# Patient Record
Sex: Male | Born: 1955 | Race: White | Hispanic: No | Smoking: Never smoker
Health system: Southern US, Community
[De-identification: ages and names within clinical notes are randomized; demographics above are authoritative.]

## PROBLEM LIST (undated history)

## (undated) DIAGNOSIS — I498 Other specified cardiac arrhythmias: Secondary | ICD-10-CM

## (undated) DIAGNOSIS — G4733 Obstructive sleep apnea (adult) (pediatric): Secondary | ICD-10-CM

## (undated) DIAGNOSIS — I10 Essential (primary) hypertension: Secondary | ICD-10-CM

## (undated) HISTORY — PX: SHOULDER SURGERY: SHX246

## (undated) HISTORY — PX: NASAL SEPTUM SURGERY: SHX37

---

## 2004-04-16 ENCOUNTER — Observation Stay: Payer: Self-pay | Admitting: Internal Medicine

## 2004-04-16 ENCOUNTER — Other Ambulatory Visit: Payer: Self-pay

## 2004-05-07 ENCOUNTER — Ambulatory Visit: Payer: Self-pay | Admitting: Specialist

## 2007-02-18 ENCOUNTER — Ambulatory Visit: Payer: Self-pay | Admitting: Otolaryngology

## 2007-06-06 ENCOUNTER — Emergency Department: Payer: Self-pay | Admitting: Unknown Physician Specialty

## 2007-08-23 ENCOUNTER — Ambulatory Visit: Payer: Self-pay | Admitting: Otolaryngology

## 2007-10-05 ENCOUNTER — Ambulatory Visit: Payer: Self-pay | Admitting: Otolaryngology

## 2007-10-15 ENCOUNTER — Ambulatory Visit: Payer: Self-pay | Admitting: Otolaryngology

## 2008-05-19 ENCOUNTER — Ambulatory Visit: Payer: Self-pay | Admitting: Unknown Physician Specialty

## 2009-01-09 ENCOUNTER — Ambulatory Visit: Payer: Self-pay | Admitting: Specialist

## 2009-03-06 ENCOUNTER — Ambulatory Visit: Payer: Self-pay | Admitting: Specialist

## 2009-03-14 ENCOUNTER — Ambulatory Visit: Payer: Self-pay | Admitting: Specialist

## 2009-04-06 ENCOUNTER — Encounter: Payer: Self-pay | Admitting: Specialist

## 2009-05-01 ENCOUNTER — Encounter: Payer: Self-pay | Admitting: Specialist

## 2010-12-03 ENCOUNTER — Emergency Department: Payer: Self-pay | Admitting: *Deleted

## 2013-05-27 ENCOUNTER — Ambulatory Visit: Payer: Self-pay | Admitting: Unknown Physician Specialty

## 2013-05-30 LAB — PATHOLOGY REPORT

## 2015-03-30 DIAGNOSIS — H52203 Unspecified astigmatism, bilateral: Secondary | ICD-10-CM | POA: Diagnosis not present

## 2015-05-14 DIAGNOSIS — E538 Deficiency of other specified B group vitamins: Secondary | ICD-10-CM | POA: Diagnosis not present

## 2015-05-14 DIAGNOSIS — Z0000 Encounter for general adult medical examination without abnormal findings: Secondary | ICD-10-CM | POA: Diagnosis not present

## 2015-05-14 DIAGNOSIS — Z125 Encounter for screening for malignant neoplasm of prostate: Secondary | ICD-10-CM | POA: Diagnosis not present

## 2015-05-14 DIAGNOSIS — I10 Essential (primary) hypertension: Secondary | ICD-10-CM | POA: Diagnosis not present

## 2015-05-21 DIAGNOSIS — E538 Deficiency of other specified B group vitamins: Secondary | ICD-10-CM | POA: Diagnosis not present

## 2015-05-21 DIAGNOSIS — Z0000 Encounter for general adult medical examination without abnormal findings: Secondary | ICD-10-CM | POA: Diagnosis not present

## 2015-07-10 DIAGNOSIS — M79644 Pain in right finger(s): Secondary | ICD-10-CM | POA: Diagnosis not present

## 2015-07-10 DIAGNOSIS — S63659A Sprain of metacarpophalangeal joint of unspecified finger, initial encounter: Secondary | ICD-10-CM | POA: Diagnosis not present

## 2015-07-10 DIAGNOSIS — M65351 Trigger finger, right little finger: Secondary | ICD-10-CM | POA: Diagnosis not present

## 2015-07-31 ENCOUNTER — Emergency Department
Admission: EM | Admit: 2015-07-31 | Discharge: 2015-07-31 | Disposition: A | Discharge status: Home / Self Care | Payer: 59 | Attending: Emergency Medicine | Admitting: Emergency Medicine

## 2015-07-31 ENCOUNTER — Encounter: Payer: Self-pay | Admitting: *Deleted

## 2015-07-31 ENCOUNTER — Emergency Department: Payer: 59

## 2015-07-31 DIAGNOSIS — S0993XA Unspecified injury of face, initial encounter: Secondary | ICD-10-CM | POA: Diagnosis not present

## 2015-07-31 DIAGNOSIS — S060X1A Concussion with loss of consciousness of 30 minutes or less, initial encounter: Secondary | ICD-10-CM | POA: Insufficient documentation

## 2015-07-31 DIAGNOSIS — Y998 Other external cause status: Secondary | ICD-10-CM | POA: Diagnosis not present

## 2015-07-31 DIAGNOSIS — Z79899 Other long term (current) drug therapy: Secondary | ICD-10-CM | POA: Diagnosis not present

## 2015-07-31 DIAGNOSIS — Y9289 Other specified places as the place of occurrence of the external cause: Secondary | ICD-10-CM | POA: Diagnosis not present

## 2015-07-31 DIAGNOSIS — R6884 Jaw pain: Secondary | ICD-10-CM | POA: Diagnosis not present

## 2015-07-31 DIAGNOSIS — I10 Essential (primary) hypertension: Secondary | ICD-10-CM | POA: Diagnosis not present

## 2015-07-31 DIAGNOSIS — S069X9A Unspecified intracranial injury with loss of consciousness of unspecified duration, initial encounter: Secondary | ICD-10-CM | POA: Diagnosis not present

## 2015-07-31 DIAGNOSIS — S0083XA Contusion of other part of head, initial encounter: Secondary | ICD-10-CM | POA: Insufficient documentation

## 2015-07-31 DIAGNOSIS — W51XXXA Accidental striking against or bumped into by another person, initial encounter: Secondary | ICD-10-CM | POA: Insufficient documentation

## 2015-07-31 DIAGNOSIS — S199XXA Unspecified injury of neck, initial encounter: Secondary | ICD-10-CM | POA: Diagnosis not present

## 2015-07-31 DIAGNOSIS — Y9364 Activity, baseball: Secondary | ICD-10-CM | POA: Insufficient documentation

## 2015-07-31 DIAGNOSIS — R220 Localized swelling, mass and lump, head: Secondary | ICD-10-CM | POA: Diagnosis not present

## 2015-07-31 HISTORY — DX: Essential (primary) hypertension: I10

## 2015-07-31 HISTORY — DX: Other specified cardiac arrhythmias: I49.8

## 2015-07-31 MED ORDER — IBUPROFEN 600 MG PO TABS: 600.0000 mg | ORAL_TABLET | Freq: Three times a day (TID) | ORAL | Status: DC | PRN

## 2015-07-31 MED ORDER — HYDROCODONE-ACETAMINOPHEN 5-325 MG PO TABS: 1.0000 | ORAL_TABLET | Freq: Four times a day (QID) | ORAL | Status: DC | PRN

## 2015-07-31 MED ORDER — IBUPROFEN 600 MG PO TABS
600.0000 mg | ORAL_TABLET | Freq: Once | ORAL | Status: AC
  Administered 2015-07-31: 600 mg via ORAL

## 2015-07-31 MED ORDER — ONDANSETRON HCL 4 MG/2ML IJ SOLN
4.0000 mg | Freq: Once | INTRAMUSCULAR | Status: AC
  Administered 2015-07-31: 4 mg via INTRAVENOUS

## 2015-07-31 MED ORDER — MORPHINE SULFATE (PF) 2 MG/ML IV SOLN
2.0000 mg | Freq: Once | INTRAVENOUS | Status: AC
Start: 2015-07-31 — End: 2015-07-31
  Administered 2015-07-31: 2 mg via INTRAVENOUS

## 2015-07-31 NOTE — Discharge Instructions (Signed)
You were evaluated after head and facial injury and no serious injuries were found. By history it sounds like you will did suffer with a concussion, need to follow up with your primary care physician to ensure complete resolution of the concussion.   Concussion, Adult A concussion is a brain injury. It is caused by:  A hit to the head.  A quick and sudden movement (jolt) of the head or neck. A concussion is usually not life threatening. Even so, it can cause serious problems. If you had a concussion before, you may have concussion-like problems after a hit to your head. HOME CARE General Instructions  Follow your doctor's directions carefully.  Take medicines only as told by your doctor.  Only take medicines your doctor says are safe.  Do not drink alcohol until your doctor says it is okay. Alcohol and some drugs can slow down healing. They can also put you at risk for further injury.  If you are having trouble remembering things, write them down.  Try to do one thing at a time if you get distracted easily. For example, do not watch TV while making dinner.  Talk to your family members or close friends when making important decisions.  Follow up with your doctor as told.  Watch your symptoms. Tell others to do the same. Serious problems can sometimes happen after a concussion. Older adults are more likely to have these problems.  Tell your teachers, school nurse, school counselor, coach, Product/process development scientist, or work Freight forwarder about your concussion. Tell them about what you can or cannot do. They should watch to see if:  It gets even harder for you to pay attention or concentrate.  It gets even harder for you to remember things or learn new things.  You need more time than normal to finish things.  You become annoyed (irritable) more than before.  You are not able to deal with stress as well.  You have more problems than before.  Rest. Make sure you:  Get plenty of sleep at  night.  Go to sleep early.  Go to bed at the same time every day. Try to wake up at the same time.  Rest during the day.  Take naps when you feel tired.  Limit activities where you have to think a lot or concentrate. These include:  Doing homework.  Doing work related to a job.  Watching TV.  Using the computer. Returning To Your Regular Activities Return to your normal activities slowly, not all at once. You must give your body and brain enough time to heal.   Do not play sports or do other athletic activities until your doctor says it is okay.  Ask your doctor when you can drive, ride a bicycle, or work other vehicles or machines. Never do these things if you feel dizzy.  Ask your doctor about when you can return to work or school. Preventing Another Concussion It is very important to avoid another brain injury, especially before you have healed. In rare cases, another injury can lead to permanent brain damage, brain swelling, or death. The risk of this is greatest during the first 7-10 days after your injury. Avoid injuries by:   Wearing a seat belt when riding in a car.  Not drinking too much alcohol.  Avoiding activities that could lead to a second concussion (such as contact sports).  Wearing a helmet when doing activities like:  Biking.  Skiing.  Skateboarding.  Skating.  Making your home  safer by:  Removing things from the floor or stairways that could make you trip.  Using grab bars in bathrooms and handrails by stairs.  Placing non-slip mats on floors and in bathtubs.  Improve lighting in dark areas. GET HELP IF:  It gets even harder for you to pay attention or concentrate.  It gets even harder for you to remember things or learn new things.  You need more time than normal to finish things.  You become annoyed (irritable) more than before.  You are not able to deal with stress as well.  You have more problems than before.  You have  problems keeping your balance.  You are not able to react quickly when you should. Get help if you have any of these problems for more than 2 weeks:   Lasting (chronic) headaches.  Dizziness or trouble balancing.  Feeling sick to your stomach (nausea).  Seeing (vision) problems.  Being affected by noises or light more than normal.  Feeling sad, low, down in the dumps, blue, gloomy, or empty (depressed).  Mood changes (mood swings).  Feeling of fear or nervousness about what may happen (anxiety).  Feeling annoyed.  Memory problems.  Problems concentrating or paying attention.  Sleep problems.  Feeling tired all the time. GET HELP RIGHT AWAY IF:   You have bad headaches or your headaches get worse.  You have weakness (even if it is in one hand, leg, or part of the face).  You have loss of feeling (numbness).  You feel off balance.  You keep throwing up (vomiting).  You feel tired.  One black center of your eye (pupil) is larger than the other.  You twitch or shake violently (convulse).  Your speech is not clear (slurred).  You are more confused, easily angered (agitated), or annoyed than before.  You have more trouble resting than before.  You are unable to recognize people or places.  You have neck pain.  It is difficult to wake you up.  You have unusual behavior changes.  You pass out (lose consciousness). MAKE SURE YOU:   Understand these instructions.  Will watch your condition.  Will get help right away if you are not doing well or get worse.   This information is not intended to replace advice given to you by your health care provider. Make sure you discuss any questions you have with your health care provider.   Document Released: 02/05/2009 Document Revised: 03/10/2014 Document Reviewed: 09/09/2012 Elsevier Interactive Patient Education 2016 [place].  Facial or Scalp Contusion  A facial or scalp contusion is a deep bruise on the  face or head. Contusions happen when an injury causes bleeding under the skin. Signs of bruising include pain, puffiness (swelling), and discolored skin. The contusion may turn blue, purple, or yellow. HOME CARE  Only take medicines as told by your doctor.  Put ice on the injured area.  Put ice in a plastic bag.  Place a towel between your skin and the bag.  Leave the ice on for 20 minutes, 2-3 times a day. GET HELP IF:  You have bite problems.  You have pain when chewing.  You are worried about your face not healing normally. GET HELP RIGHT AWAY IF:   You have severe pain or a headache and medicine does not help.  You are very tired or confused, or your personality changes.  You throw up (vomit).  You have a nosebleed that will not stop.  You see two  of everything (double vision) or have blurry vision.  You have fluid coming from your nose or ear.  You have problems walking or using your arms or legs. MAKE SURE YOU:   Understand these instructions.  Will watch your condition.  Will get help right away if you are not doing well or get worse.   This information is not intended to replace advice given to you by your health care provider. Make sure you discuss any questions you have with your health care provider.   Document Released: 02/06/2011 Document Revised: 03/10/2014 Document Reviewed: 09/30/2012 Elsevier Interactive Patient Education [employer].

## 2015-07-31 NOTE — ED Provider Notes (Addendum)
[facility] Emergency Department Provider Note   ____________________________________________  Time seen: Approximately 9:25 PM I have reviewed the triage vital signs and the triage nursing note.  HISTORY  Chief Complaint Facial Injury   [place] Patient, wife, and patient's son who was at the game  HPI Greg Barnes is a 60 y.o. male not on any blood thinners, who was playing in a softball game when he collided with another player going for a fly ball, and was struck in the left side of the face with the other player's shoulder. He had immediate loss of consciousness and fell to the ground and protected. He had loss of consciousness for approximately 45 seconds and then in and out of consciousness for about 3-4 more minutes before fully regain consciousness. He still has some amnesia to the event. He is complaining of left sided headache, left facial pain, and some blood in the mouth. He is also complaining of some neck pain, was apparently moderate at the time of injury, and now somewhat better.  His left facial pain is the worst over the left maxilla, and he is rating it about a 7 out of 10.  No back pain, chest pain, trouble breathing, abdominal pain, or extremity pain.    Past Medical History  Diagnosis Date  . Hypertension   . Sinus arrhythmia     intermittent    There are no active problems to display for this patient.   Past Surgical History  Procedure Laterality Date  . Nasal septum surgery    . Shoulder surgery Right     Current Outpatient Rx  Name  Route  Sig  Dispense  Refill  . cyanocobalamin 500 MCG tablet   Oral   Take 500 mcg by mouth daily.         Marland Kitchen telmisartan (MICARDIS) 40 MG tablet   Oral   Take 1 tablet by mouth daily.      3   . HYDROcodone-acetaminophen (NORCO/VICODIN) 5-325 MG tablet   Oral   Take 1 tablet by mouth every 6 (six) hours as needed for moderate pain.   10 tablet   0   . ibuprofen  (ADVIL,MOTRIN) 600 MG tablet   Oral   Take 1 tablet (600 mg total) by mouth every 8 (eight) hours as needed.   20 tablet   0     Allergies Review of patient's allergies indicates no known allergies.  No family history on file.  Social History Social History  Substance Use Topics  . Smoking status: Never Smoker   . Smokeless tobacco: None  . Alcohol Use: No    Review of Systems  Constitutional: Negative for Recent illnesses Eyes: Negative for visual changes. ENT: Negative for sore throat. Cardiovascular: Negative for chest pain. Respiratory: Negative for shortness of breath. Gastrointestinal: Negative for abdominal pain. Genitourinary:  Musculoskeletal: Negative for back pain. Skin: Negative for rash. Neurological: Positive for headache. 10 point Review of Systems otherwise negative ____________________________________________   PHYSICAL EXAM:  VITAL SIGNS: ED Triage Vitals  Enc Vitals Group     BP 07/31/15 2027 167/76 mmHg     Pulse Rate 07/31/15 2027 76     Resp 07/31/15 2027 18     Temp 07/31/15 2027 97.3 F (36.3 C)     Temp Source 07/31/15 2027 Oral     SpO2 07/31/15 2027 98 %     Weight 07/31/15 2027 226 lb (102.513 kg)     Height 07/31/15 2027 6\' 2"  (  1.88 m)     Head Cir --      Peak Flow --      Pain Score 07/31/15 2028 8     Pain Loc --      Pain Edu? --      Excl. in [place]? --      Constitutional: Alert and oriented. Well appearing and in no distress. HEENT   Head: Left maxilla swelling and tenderness.      Eyes: Conjunctivae are normal. PERRL. Normal extraocular movements.      Ears:         Nose: No congestion/rhinnorhea.   Mouth/Throat: Mucous membranes are moist.  No apparent dental traumatic injury. No oral lacerations visualized, but there is some blood near the upper incisor gum on the left, without the tooth being loose.   Neck: No stridor.  Mild cervical spine tenderness mostly paraspinous, no step-offs. Cardiovascular/Chest:  Normal rate, regular rhythm.  No murmurs, rubs, or gallops. Respiratory: Normal respiratory effort without tachypnea nor retractions. Breath sounds are clear and equal bilaterally. No wheezes/rales/rhonchi. Gastrointestinal: Soft. No distention, no guarding, no rebound. Nontender.    Genitourinary/rectal:Deferred Musculoskeletal: Nontender with normal range of motion in all extremities.  Neurologic:  Normal speech and language. No gross or focal neurologic deficits are appreciated. Skin:  Skin is warm, dry and intact. No rash noted. Psychiatric: Mood and affect are normal. Speech and behavior are normal. Patient exhibits appropriate insight and judgment.  ____________________________________________   EKG I, Lisa Roca, MD, the attending physician have personally viewed and interpreted all ECGs.  82 bpm. Normal sinus rhythm. Narrow QRS. Normal axis. Nonspecific T wave. ____________________________________________  LABS (pertinent positives/negatives)  Labs Reviewed - No data to display  ____________________________________________  RADIOLOGY All Xrays were viewed by me. Imaging interpreted by Radiologist.  CT head, cervical spine, and maxillofacial without contrast:  IMPRESSION: Normal head CT.  Mild soft tissue swelling overlying the left frontal bone and left maxilla. No evidence of maxillofacial fracture.  No evidence of traumatic injury to the cervical spine. Mild degenerative changes. __________________________________________  PROCEDURES  Procedure(s) performed: None  Critical Care performed: None  ____________________________________________   ED COURSE / ASSESSMENT AND PLAN  Pertinent labs & imaging results that were available during my care of the patient were reviewed by me and considered in my medical decision making (see chart for details).   I did go ahead and asked to have a cervical collar placed given the abnormal little concerned about the  mechanisms of the fall having lost consciousness midair and then falling to the ground. He is a little bit distracted by the pain at his left face that I discussed that I am recommending CT of the cervical spine in addition to the CT of the head and face.   No trauma, and imaging.  I discussed return precautions with respect to concussion, and typical course with musculoskeletal injury.    CONSULTATIONS:   None   Patient / Family / Caregiver informed of clinical course, medical decision-making process, and agree with plan.   I discussed return precautions, follow-up instructions, and discharged instructions with patient and/or family.   ___________________________________________   FINAL CLINICAL IMPRESSION(S) / ED DIAGNOSES   Final diagnoses:  Facial contusion, initial encounter  Concussion, with loss of consciousness of 30 minutes or less, initial encounter              Note: This dictation was prepared with Dragon dictation. Any transcriptional errors that result from this process are  unintentional   Lisa Roca, MD 07/31/15 [id number]  Lisa Roca, MD 07/31/15 [id number]

## 2015-07-31 NOTE — ED Notes (Signed)
C-collar removed by Dr. Reita Cliche.

## 2015-07-31 NOTE — ED Notes (Signed)
c-collar applied  

## 2015-07-31 NOTE — ED Notes (Signed)
Per EMS report, patient was playing softball and went for a fly ball along with his teammate. The teammate's shoulder hit the left side of patient's face. Family reports LOC for 30-40 seconds. Patient did have a period of being in and out of consciousness. Patient is c/o left jaw and shoulder pain. Patient has been spitting out blood. Deformity noted on Left jaw.

## 2015-08-03 DIAGNOSIS — R51 Headache: Secondary | ICD-10-CM | POA: Diagnosis not present

## 2015-08-03 DIAGNOSIS — F0781 Postconcussional syndrome: Secondary | ICD-10-CM | POA: Diagnosis not present

## 2016-02-11 DIAGNOSIS — M65351 Trigger finger, right little finger: Secondary | ICD-10-CM | POA: Diagnosis not present

## 2016-05-07 DIAGNOSIS — H524 Presbyopia: Secondary | ICD-10-CM | POA: Diagnosis not present

## 2016-05-14 DIAGNOSIS — Z0000 Encounter for general adult medical examination without abnormal findings: Secondary | ICD-10-CM | POA: Diagnosis not present

## 2016-05-14 DIAGNOSIS — R972 Elevated prostate specific antigen [PSA]: Secondary | ICD-10-CM | POA: Diagnosis not present

## 2016-05-14 DIAGNOSIS — E538 Deficiency of other specified B group vitamins: Secondary | ICD-10-CM | POA: Diagnosis not present

## 2016-05-28 DIAGNOSIS — Z0000 Encounter for general adult medical examination without abnormal findings: Secondary | ICD-10-CM | POA: Diagnosis not present

## 2016-10-27 DIAGNOSIS — G5761 Lesion of plantar nerve, right lower limb: Secondary | ICD-10-CM | POA: Diagnosis not present

## 2016-12-05 DIAGNOSIS — L570 Actinic keratosis: Secondary | ICD-10-CM | POA: Diagnosis not present

## 2016-12-05 DIAGNOSIS — D2262 Melanocytic nevi of left upper limb, including shoulder: Secondary | ICD-10-CM | POA: Diagnosis not present

## 2016-12-05 DIAGNOSIS — D2261 Melanocytic nevi of right upper limb, including shoulder: Secondary | ICD-10-CM | POA: Diagnosis not present

## 2016-12-05 DIAGNOSIS — L821 Other seborrheic keratosis: Secondary | ICD-10-CM | POA: Diagnosis not present

## 2016-12-05 DIAGNOSIS — D225 Melanocytic nevi of trunk: Secondary | ICD-10-CM | POA: Diagnosis not present

## 2016-12-05 DIAGNOSIS — X32XXXA Exposure to sunlight, initial encounter: Secondary | ICD-10-CM | POA: Diagnosis not present

## 2017-02-25 DIAGNOSIS — I10 Essential (primary) hypertension: Secondary | ICD-10-CM | POA: Diagnosis not present

## 2017-05-22 DIAGNOSIS — Z0000 Encounter for general adult medical examination without abnormal findings: Secondary | ICD-10-CM | POA: Diagnosis not present

## 2017-05-29 DIAGNOSIS — Z0000 Encounter for general adult medical examination without abnormal findings: Secondary | ICD-10-CM | POA: Diagnosis not present

## 2017-05-29 DIAGNOSIS — C44311 Basal cell carcinoma of skin of nose: Secondary | ICD-10-CM | POA: Diagnosis not present

## 2017-06-01 DIAGNOSIS — H903 Sensorineural hearing loss, bilateral: Secondary | ICD-10-CM | POA: Diagnosis not present

## 2017-06-01 DIAGNOSIS — D485 Neoplasm of uncertain behavior of skin: Secondary | ICD-10-CM | POA: Diagnosis not present

## 2017-06-01 DIAGNOSIS — C44321 Squamous cell carcinoma of skin of nose: Secondary | ICD-10-CM | POA: Diagnosis not present

## 2017-07-29 DIAGNOSIS — C44321 Squamous cell carcinoma of skin of nose: Secondary | ICD-10-CM | POA: Diagnosis not present

## 2017-07-29 DIAGNOSIS — L988 Other specified disorders of the skin and subcutaneous tissue: Secondary | ICD-10-CM | POA: Diagnosis not present

## 2017-07-29 DIAGNOSIS — L578 Other skin changes due to chronic exposure to nonionizing radiation: Secondary | ICD-10-CM | POA: Diagnosis not present

## 2017-07-29 DIAGNOSIS — L814 Other melanin hyperpigmentation: Secondary | ICD-10-CM | POA: Diagnosis not present

## 2017-08-31 DIAGNOSIS — M542 Cervicalgia: Secondary | ICD-10-CM | POA: Diagnosis not present

## 2017-11-06 DIAGNOSIS — G4733 Obstructive sleep apnea (adult) (pediatric): Secondary | ICD-10-CM | POA: Diagnosis not present

## 2017-12-02 DIAGNOSIS — G4733 Obstructive sleep apnea (adult) (pediatric): Secondary | ICD-10-CM | POA: Diagnosis not present

## 2017-12-04 DIAGNOSIS — H903 Sensorineural hearing loss, bilateral: Secondary | ICD-10-CM | POA: Diagnosis not present

## 2017-12-05 DIAGNOSIS — G4733 Obstructive sleep apnea (adult) (pediatric): Secondary | ICD-10-CM | POA: Diagnosis not present

## 2017-12-22 DIAGNOSIS — D2271 Melanocytic nevi of right lower limb, including hip: Secondary | ICD-10-CM | POA: Diagnosis not present

## 2017-12-22 DIAGNOSIS — L821 Other seborrheic keratosis: Secondary | ICD-10-CM | POA: Diagnosis not present

## 2017-12-22 DIAGNOSIS — L570 Actinic keratosis: Secondary | ICD-10-CM | POA: Diagnosis not present

## 2017-12-22 DIAGNOSIS — X32XXXA Exposure to sunlight, initial encounter: Secondary | ICD-10-CM | POA: Diagnosis not present

## 2017-12-22 DIAGNOSIS — D2261 Melanocytic nevi of right upper limb, including shoulder: Secondary | ICD-10-CM | POA: Diagnosis not present

## 2017-12-22 DIAGNOSIS — D2272 Melanocytic nevi of left lower limb, including hip: Secondary | ICD-10-CM | POA: Diagnosis not present

## 2017-12-22 DIAGNOSIS — D2262 Melanocytic nevi of left upper limb, including shoulder: Secondary | ICD-10-CM | POA: Diagnosis not present

## 2018-01-13 DIAGNOSIS — Z9989 Dependence on other enabling machines and devices: Secondary | ICD-10-CM | POA: Diagnosis not present

## 2018-01-13 DIAGNOSIS — G4733 Obstructive sleep apnea (adult) (pediatric): Secondary | ICD-10-CM | POA: Diagnosis not present

## 2018-07-07 ENCOUNTER — Encounter: Payer: Self-pay | Admitting: Emergency Medicine

## 2018-07-07 ENCOUNTER — Other Ambulatory Visit: Payer: Self-pay

## 2018-07-07 ENCOUNTER — Emergency Department: Payer: BLUE CROSS/BLUE SHIELD

## 2018-07-07 ENCOUNTER — Emergency Department
Admission: EM | Admit: 2018-07-07 | Discharge: 2018-07-07 | Disposition: A | Discharge status: Home / Self Care | Payer: BLUE CROSS/BLUE SHIELD | Attending: Emergency Medicine | Admitting: Emergency Medicine

## 2018-07-07 DIAGNOSIS — I10 Essential (primary) hypertension: Secondary | ICD-10-CM | POA: Insufficient documentation

## 2018-07-07 DIAGNOSIS — H539 Unspecified visual disturbance: Secondary | ICD-10-CM | POA: Insufficient documentation

## 2018-07-07 DIAGNOSIS — Z79899 Other long term (current) drug therapy: Secondary | ICD-10-CM | POA: Insufficient documentation

## 2018-07-07 HISTORY — DX: Obstructive sleep apnea (adult) (pediatric): G47.33

## 2018-07-07 LAB — CBC WITH DIFFERENTIAL/PLATELET
Abs Immature Granulocytes: 0.03 10*3/uL (ref 0.00–0.07)
Basophils Absolute: 0 10*3/uL (ref 0.0–0.1)
Basophils Relative: 1 %
Eosinophils Absolute: 0.1 10*3/uL (ref 0.0–0.5)
Eosinophils Relative: 2 %
HCT: 43.8 % (ref 39.0–52.0)
Hemoglobin: 14.8 g/dL (ref 13.0–17.0)
Immature Granulocytes: 1 %
Lymphocytes Relative: 35 %
Lymphs Abs: 2 10*3/uL (ref 0.7–4.0)
MCH: 29.5 pg (ref 26.0–34.0)
MCHC: 33.8 g/dL (ref 30.0–36.0)
MCV: 87.4 fL (ref 80.0–100.0)
Monocytes Absolute: 0.5 10*3/uL (ref 0.1–1.0)
Monocytes Relative: 9 %
Neutro Abs: 3.2 10*3/uL (ref 1.7–7.7)
Neutrophils Relative %: 52 %
Platelets: 272 10*3/uL (ref 150–400)
RBC: 5.01 MIL/uL (ref 4.22–5.81)
RDW: 12.4 % (ref 11.5–15.5)
WBC: 5.9 10*3/uL (ref 4.0–10.5)
nRBC: 0 % (ref 0.0–0.2)

## 2018-07-07 LAB — COMPREHENSIVE METABOLIC PANEL
ALT: 19 U/L (ref 0–44)
AST: 25 U/L (ref 15–41)
Albumin: 4.6 g/dL (ref 3.5–5.0)
Alkaline Phosphatase: 69 U/L (ref 38–126)
Anion gap: 9 (ref 5–15)
BUN: 17 mg/dL (ref 8–23)
CO2: 25 mmol/L (ref 22–32)
Calcium: 9.8 mg/dL (ref 8.9–10.3)
Chloride: 104 mmol/L (ref 98–111)
Creatinine, Ser: 0.94 mg/dL (ref 0.61–1.24)
GFR calc Af Amer: >60 mL/min (ref >60)
GFR calc non Af Amer: >60 mL/min (ref >60)
Glucose, Bld: 113 mg/dL — ABNORMAL HIGH (ref 70–99)
Potassium: 4.2 mmol/L (ref 3.5–5.1)
Sodium: 138 mmol/L (ref 135–145)
Total Bilirubin: 0.7 mg/dL (ref 0.3–1.2)
Total Protein: 7.8 g/dL (ref 6.5–8.1)

## 2018-07-07 LAB — ETHANOL: Alcohol, Ethyl (B): <10 mg/dL (ref <10)

## 2018-07-07 LAB — GLUCOSE, CAPILLARY: Glucose-Capillary: 102 mg/dL — ABNORMAL HIGH (ref 70–99)

## 2018-07-07 LAB — PROTIME-INR
INR: 0.9 (ref 0.8–1.2)
Prothrombin Time: 11.9 seconds (ref 11.4–15.2)

## 2018-07-07 MED ORDER — IOHEXOL 350 MG/ML SOLN
75.0000 mL | Freq: Once | INTRAVENOUS | Status: AC | PRN
  Administered 2018-07-07: 75 mL via INTRAVENOUS

## 2018-07-07 NOTE — ED Triage Notes (Signed)
Pt presents to ED via POV with c/o loss of vision in L eye for approx 2-2.77min with return of vision. Pt also c/o posterior HA that started at that time. Pt is alert and oriented at this time. Ambulatory without difficulty.

## 2018-07-07 NOTE — ED Notes (Signed)
Visual acuity preformed with corrective lenses. R eye 20/25, L eye 20/25 and bilaterally 20/25. MD Mcshane made aware of the results.

## 2018-07-07 NOTE — ED Notes (Signed)
Patient transported to CT 

## 2018-07-07 NOTE — ED Notes (Signed)
Patient also complains of occipital headache

## 2018-07-07 NOTE — ED Notes (Signed)
Patient neurologically intact

## 2018-07-07 NOTE — Discharge Instructions (Signed)
This likely this was a atypical presentation of your chronic vision problems.  We see no evidence at this time of anything significant.  Your work-up is quite reassuring.  If you have any numbness weakness change in vision or hearing that is atypical for you, trouble walking or talking or other new or worrisome complaints of course return to the emergency room.  Otherwise call your primary care doctor and your eye doctor first thing tomorrow for an appointment.

## 2018-07-07 NOTE — ED Provider Notes (Addendum)
[facility] Emergency Department Provider Note  ____________________________________________   I have reviewed the triage vital signs and the nursing notes. Where available I have reviewed prior notes and, if possible and indicated, outside hospital notes.    HISTORY  Chief Complaint Loss of Vision    HPI Greg Barnes is a 63 y.o. male  With a history of ocular migraines, which is usually bilateral visual loss, happens once every 3 weeks, for very brief periods of time.  He does not recollect it happening on just one eye however.  He states that this morning he had a brief period of left-sided visual decreased vision.  "Like Vaseline on my glasses".  Right eye was fine.  Could see a small sliver through the central of his visual field which was normal.  Did not have any fall or headache.  He states he has had a slight pulled muscle in his neck for the last couple days "because I slept wrong on a pillow".  It is reproducible at the insertion of the trapezius muscle he does not feel that it is related to his visual field deficit but it is brought up and review of systems.  It is not a headache per se.  He did not have any focal numbness or weakness he has no change in speech or difficulty walking and his visual field deficit completely resolved after about 2-1/2 minutes which is a normal amount of time that usually takes his migraines to get better.  He has been followed closely by ophthalmology for this.  He has not had an MRI.  He did have a new 2017 CT scan which was normal. Was for trauma at that time.  Patient has had no symptoms since this occurred.  Happened at rest while he was working on the computer.  He states usually his migraines are triggered by caffeine intake he did have his normal amount of caffeine this morning which is at least 6 cups.  Past Medical History:  Diagnosis Date  . Hypertension   . Obstructive sleep apnea   . Sinus arrhythmia     intermittent    There are no active problems to display for this patient.   Past Surgical History:  Procedure Laterality Date  . NASAL SEPTUM SURGERY    . SHOULDER SURGERY Right     Prior to Admission medications   Medication Sig Start Date End Date Taking? Authorizing Provider  cyanocobalamin 500 MCG tablet Take 500 mcg by mouth daily.    [provider]  HYDROcodone-acetaminophen (NORCO/VICODIN) 5-325 MG tablet Take 1 tablet by mouth every 6 (six) hours as needed for moderate pain. 07/31/15   Lisa Roca, MD  ibuprofen (ADVIL,MOTRIN) 600 MG tablet Take 1 tablet (600 mg total) by mouth every 8 (eight) hours as needed. 07/31/15   Lisa Roca, MD  telmisartan (MICARDIS) 40 MG tablet Take 1 tablet by mouth daily. 05/21/15   [provider]    Allergies Patient has no known allergies.  History reviewed. No pertinent family history.  Social History Social History   Tobacco Use  . Smoking status: Never Smoker  . Smokeless tobacco: Never Used  Substance Use Topics  . Alcohol use: Yes    Comment: Occ  . Drug use: Never    Review of Systems Constitutional: No fever/chills Eyes: No visual changes. ENT: No sore throat. No stiff neck no neck pain Cardiovascular: Denies chest pain. Respiratory: Denies shortness of breath. Gastrointestinal:   no vomiting.  No diarrhea.  No constipation. Genitourinary: Negative for dysuria. Musculoskeletal: Negative lower extremity swelling Skin: Negative for rash. Neurological: Negative for severe headaches, focal weakness or numbness.   ____________________________________________   PHYSICAL EXAM:  VITAL SIGNS: ED Triage Vitals  Enc Vitals Group     BP 07/07/18 1202 (!) 170/78     Pulse Rate 07/07/18 1202 61     Resp 07/07/18 1202 18     Temp 07/07/18 1202 98.2 F (36.8 C)     Temp Source 07/07/18 1202 Oral     SpO2 07/07/18 1202 98 %     Weight 07/07/18 1203 208 lb (94.3 kg)     Height 07/07/18 1203 6\' 2"   (1.88 m)     Head Circumference --      Peak Flow --      Pain Score 07/07/18 1203 3     Pain Loc --      Pain Edu? --      Excl. in [place]? --     Constitutional: Alert and oriented. Well appearing and in no acute distress. Eyes: Conjunctivae are normal, no evidence of ocular pathology noted and his funduscopic exam is reassuring without dilation Head: Atraumatic HEENT: No congestion/rhinnorhea. Mucous membranes are moist.  Oropharynx non-erythematous Neck:   Very slight tenderness to palpation at the insertion of the left trapezius muscle which patient describes as being the discomfort that he felt.  Also mild discomfort with ranging the neck but no midline tenderness no redness no erythema not warm to touch no shingles no evidence of acute pathology noted with no meningismus, no masses, no stridor Cardiovascular: Normal rate, regular rhythm. Grossly normal heart sounds.  Good peripheral circulation. Respiratory: Normal respiratory effort.  No retractions. Lungs CTAB. Abdominal: Soft and nontender. No distention. No guarding no rebound Back:  There is no focal tenderness or step off.  there is no midline tenderness there are no lesions noted. there is no CVA tenderness Musculoskeletal: No lower extremity tenderness, no upper extremity tenderness. No joint effusions, no DVT signs strong distal pulses no edema Neurologic: Cranial nerves II through XII are grossly intact 5 out of 5 strength bilateral upper and lower extremity. Finger to nose within normal limits heel to shin within normal limits, speech is normal with no word finding difficulty or dysarthria, reflexes symmetric, pupils are equally round and reactive to light, there is no pronator drift, sensation is normal, vision is intact to confrontation, gait is deferred, there is no nystagmus, normal neurologic exam Skin:  Skin is warm, dry and intact. No rash noted. Psychiatric: Mood and affect are normal. Speech and behavior are  normal.  ____________________________________________   LABS (all labs ordered are listed, but only abnormal results are displayed)  Labs Reviewed  GLUCOSE, CAPILLARY - Abnormal; Notable for the following components:      Result Value   Glucose-Capillary 102 (*)    All other components within normal limits  CBC WITH DIFFERENTIAL/PLATELET  PROTIME-INR  ETHANOL  URINALYSIS, COMPLETE (UACMP) WITH MICROSCOPIC  COMPREHENSIVE METABOLIC PANEL    Pertinent labs  results that were available during my care of the patient were reviewed by me and considered in my medical decision making (see chart for details). ____________________________________________  EKG  I personally interpreted any EKGs ordered by me or triage Normal sinus rhythm rate 71 bpm no acute ST elevation depression nonspecific ST changes, ____________________________________________  RADIOLOGY  Pertinent labs & imaging results that were available during my care of the patient were reviewed by  me and considered in my medical decision making (see chart for details). If possible, patient and/or family made aware of any abnormal findings.  No results found. ____________________________________________    PROCEDURES  Procedure(s) performed: None  Procedures  Critical Care performed: None  ____________________________________________   INITIAL IMPRESSION / ASSESSMENT AND PLAN / ED COURSE  Pertinent labs & imaging results that were available during my care of the patient were reviewed by me and considered in my medical decision making (see chart for details).  Patient had a painless transitory visual change in his left eye which was brief and completely resolved at this time.  Does have a history of ocular migraines with recurrent chronic 10 years of similar visual changes in both eyes.  This seems slightly different mostly because was in one eye and not the other and there may have been some other aspects of it that  seemed a little different.  He does have a slight discomfort in the back of his head which is been there for several days, and seems external and clearly muscle related.  He is never had an MRI for his ocular migraines but they are of 10-year duration.  At this time his NIH stroke scale is 0.  His age I will obtain a CT scan of his head and a CTA of his head to further evaluate the vasculature and make sure there is no other pathology present and we will talk to ophthalmology after.   ----------------------------------------- 3:16 PM on 07/07/2018 -----------------------------------------  CT scans are reassuring, I have personally reviewed the images and read radiology report.  Patient remains asymptomatic with an NIH stroke scale of 0.  I talked to Dr. Edison Pace, of ophthalmology.  He does not feel the patient needs acute admission or further work-up at this time in the emergency department.  He does not feel the patient needs MRI or further imaging, they will see him at his ophthalmologist office tomorrow.  Patient and his son are both very comfortable with this plan.  I certainly do not think PE is indicated and I talked to the family and they agree as well as the patient.  We will therefore discharge the patient with close outpatient follow-up, he does have chronic recurrent episodic visual abnormalities which has been going on for a decade several times a month.  He does not actually have a headache he does have a little bit of neck discomfort from sleeping funny and there is nothing on CTA to suggest aneurysmal event etc.  Patient and family are very comfortable with this plan.  We will discharge.  Precautions were given and understood.   ____________________________________________   FINAL CLINICAL IMPRESSION(S) / ED DIAGNOSES  Final diagnoses:  None      This chart was dictated using voice recognition software.  Despite best efforts to proofread,  errors can occur which can change meaning.       Schuyler Amor, MD 07/07/18 1311    Schuyler Amor, MD 07/07/18 [phone]

## 2019-02-04 ENCOUNTER — Other Ambulatory Visit
Admission: RE | Admit: 2019-02-04 | Discharge: 2019-02-04 | Disposition: A | Discharge status: Home / Self Care | Payer: BLUE CROSS/BLUE SHIELD | Source: Ambulatory Visit | Attending: Internal Medicine | Admitting: Internal Medicine

## 2019-02-04 DIAGNOSIS — Z01812 Encounter for preprocedural laboratory examination: Secondary | ICD-10-CM | POA: Diagnosis present

## 2019-02-04 DIAGNOSIS — Z20828 Contact with and (suspected) exposure to other viral communicable diseases: Secondary | ICD-10-CM | POA: Insufficient documentation

## 2019-02-04 LAB — SARS CORONAVIRUS 2 (TAT 6-24 HRS): SARS Coronavirus 2: NEGATIVE

## 2019-02-09 ENCOUNTER — Encounter: Payer: Self-pay | Admitting: *Deleted

## 2019-02-09 ENCOUNTER — Ambulatory Visit
Admission: RE | Admit: 2019-02-09 | Discharge: 2019-02-09 | Disposition: A | Discharge status: Home / Self Care | Payer: BLUE CROSS/BLUE SHIELD | Attending: Internal Medicine | Admitting: Internal Medicine

## 2019-02-09 ENCOUNTER — Ambulatory Visit: Payer: BLUE CROSS/BLUE SHIELD | Admitting: Anesthesiology

## 2019-02-09 ENCOUNTER — Encounter
Admission: RE | Disposition: A | Discharge status: Home / Self Care | Payer: Self-pay | Source: Home / Self Care | Attending: Internal Medicine

## 2019-02-09 DIAGNOSIS — Z79899 Other long term (current) drug therapy: Secondary | ICD-10-CM | POA: Diagnosis not present

## 2019-02-09 DIAGNOSIS — D122 Benign neoplasm of ascending colon: Secondary | ICD-10-CM | POA: Insufficient documentation

## 2019-02-09 DIAGNOSIS — Z800 Family history of malignant neoplasm of digestive organs: Secondary | ICD-10-CM | POA: Insufficient documentation

## 2019-02-09 DIAGNOSIS — Z1211 Encounter for screening for malignant neoplasm of colon: Secondary | ICD-10-CM | POA: Insufficient documentation

## 2019-02-09 DIAGNOSIS — K640 First degree hemorrhoids: Secondary | ICD-10-CM | POA: Insufficient documentation

## 2019-02-09 DIAGNOSIS — I499 Cardiac arrhythmia, unspecified: Secondary | ICD-10-CM | POA: Insufficient documentation

## 2019-02-09 DIAGNOSIS — I10 Essential (primary) hypertension: Secondary | ICD-10-CM | POA: Diagnosis not present

## 2019-02-09 DIAGNOSIS — G4733 Obstructive sleep apnea (adult) (pediatric): Secondary | ICD-10-CM | POA: Insufficient documentation

## 2019-02-09 HISTORY — PX: COLONOSCOPY WITH PROPOFOL: SHX5780

## 2019-02-09 SURGERY — COLONOSCOPY WITH PROPOFOL
Anesthesia: General

## 2019-02-09 MED ORDER — SODIUM CHLORIDE 0.9 % IV SOLN
INTRAVENOUS | Status: DC
  Administered 2019-02-09: 08:00:00 via INTRAVENOUS

## 2019-02-09 MED ORDER — PROPOFOL 500 MG/50ML IV EMUL
INTRAVENOUS | Status: AC
  Filled 2019-02-09: qty 50

## 2019-02-09 MED ORDER — SUCCINYLCHOLINE CHLORIDE 20 MG/ML IJ SOLN
INTRAMUSCULAR | Status: AC
  Filled 2019-02-09: qty 1

## 2019-02-09 MED ORDER — LIDOCAINE HCL (PF) 2 % IJ SOLN
INTRAMUSCULAR | Status: AC
  Filled 2019-02-09: qty 10

## 2019-02-09 MED ORDER — EPHEDRINE SULFATE-NACL 50-0.9 MG/10ML-% IV SOSY
PREFILLED_SYRINGE | INTRAVENOUS | Status: DC | PRN
  Administered 2019-02-09: 10 mg via INTRAVENOUS

## 2019-02-09 MED ORDER — PROPOFOL 500 MG/50ML IV EMUL
INTRAVENOUS | Status: DC | PRN
  Administered 2019-02-09: 140 ug/kg/min via INTRAVENOUS

## 2019-02-09 MED ORDER — LIDOCAINE HCL (CARDIAC) PF 100 MG/5ML IV SOSY
PREFILLED_SYRINGE | INTRAVENOUS | Status: DC | PRN
  Administered 2019-02-09: 50 mg via INTRAVENOUS

## 2019-02-09 MED ORDER — PROPOFOL 10 MG/ML IV BOLUS
INTRAVENOUS | Status: DC | PRN
  Administered 2019-02-09: 90 mg via INTRAVENOUS

## 2019-02-09 NOTE — Anesthesia Preprocedure Evaluation (Signed)
Anesthesia Evaluation  Patient identified by MRN, date of birth, ID band Patient awake    Reviewed: Allergy & Precautions, NPO status , Patient's Chart, lab work & pertinent test results  History of Anesthesia Complications Negative for: history of anesthetic complications  Airway Mallampati: III       Dental   Pulmonary sleep apnea and Continuous Positive Airway Pressure Ventilation , neg COPD, Not current smoker,           Cardiovascular hypertension, Pt. on medications (-) Past MI and (-) CHF (-) dysrhythmias (hx of junctional rhythm once 15 yrs ago, no tx, resolved ) (-) Valvular Problems/Murmurs     Neuro/Psych neg Seizures    GI/Hepatic Neg liver ROS, neg GERD  ,  Endo/Other  neg diabetes  Renal/GU negative Renal ROS     Musculoskeletal   Abdominal   Peds  Hematology   Anesthesia Other Findings   Reproductive/Obstetrics                             Anesthesia Physical Anesthesia Plan  ASA: II  Anesthesia Plan: General   Post-op Pain Management:    Induction: Intravenous  PONV Risk Score and Plan: 2 and TIVA and Propofol infusion  Airway Management Planned: Nasal Cannula  Additional Equipment:   Intra-op Plan:   Post-operative Plan:   Informed Consent: I have reviewed the patients History and Physical, chart, labs and discussed the procedure including the risks, benefits and alternatives for the proposed anesthesia with the patient or authorized representative who has indicated his/her understanding and acceptance.       Plan Discussed with:   Anesthesia Plan Comments:         Anesthesia Quick Evaluation

## 2019-02-09 NOTE — Interval H&P Note (Signed)
History and Physical Interval Note:  02/09/2019 8:27 AM  Greg Barnes  has presented today for surgery, with the diagnosis of FAMILY HX.OF COLON CANCER.  The various methods of treatment have been discussed with the patient and family. After consideration of risks, benefits and other options for treatment, the patient has consented to  Procedure(s): COLONOSCOPY WITH PROPOFOL (N/A) as a surgical intervention.  The patient's history has been reviewed, patient examined, no change in status, stable for surgery.  I have reviewed the patient's chart and labs.  Questions were answered to the patient's satisfaction.     Brinckerhoff, Natalia

## 2019-02-09 NOTE — Anesthesia Post-op Follow-up Note (Signed)
Anesthesia QCDR form completed.        

## 2019-02-09 NOTE — Anesthesia Postprocedure Evaluation (Signed)
Anesthesia Post Note  Patient: Greg Barnes  Procedure(s) Performed: COLONOSCOPY WITH PROPOFOL (N/A )  Patient location during evaluation: Endoscopy Anesthesia Type: General Level of consciousness: awake and alert Pain management: pain level controlled Vital Signs Assessment: post-procedure vital signs reviewed and stable Respiratory status: spontaneous breathing and respiratory function stable Cardiovascular status: stable Anesthetic complications: no     Last Vitals:  Vitals:   02/09/19 0902 02/09/19 0912  BP: 107/62 109/64  Pulse: 72 67  Resp: 17 14  Temp:    SpO2: 94% 95%    Last Pain:  Vitals:   02/09/19 0912  TempSrc:   PainSc: 0-No pain                 KEPHART,WILLIAM K

## 2019-02-09 NOTE — Op Note (Signed)
[facility] Patient Name: Greg Barnes Procedure Date: 02/09/2019 8:20 AM MRN: [id number] Account #: [id number] Date of Birth: 12/28/1955 Admit Type: Outpatient Age: 63 Room: [facility] Endoscopy ENDO ROOM 3 Gender: Male Note Status: Finalized Procedure:             Colonoscopy Indications:           Screening in patient at increased risk: Family history                         of 1st-degree relative with colorectal cancer Providers:             Benay Pike. Belma Dyches MD, MD Medicines:             Propofol per Anesthesia Complications:         No immediate complications. Procedure:             Pre-Anesthesia Assessment:                        - The risks and benefits of the procedure and the                         sedation options and risks were discussed with the                         patient. All questions were answered and informed                         consent was obtained.                        - Patient identification and proposed procedure were                         verified prior to the procedure by the nurse. The                         procedure was verified in the procedure room.                        - ASA Grade Assessment: III - A patient with severe                         systemic disease.                        - After reviewing the risks and benefits, the patient                         was deemed in satisfactory condition to undergo the                         procedure.                        After obtaining informed consent, the colonoscope was                         passed under direct vision. Throughout the procedure,  the patient's blood pressure, pulse, and oxygen                         saturations were monitored continuously. The                         Colonoscope was introduced through the anus and                         advanced to the the cecum, identified by appendiceal   orifice and ileocecal valve. The colonoscopy was                         performed without difficulty. The patient tolerated                         the procedure well. The quality of the bowel                         preparation was good. The ileocecal valve, appendiceal                         orifice, and rectum were photographed. Findings:      The perianal and digital rectal examinations were normal. Pertinent       negatives include normal sphincter tone and no palpable rectal lesions.      Non-bleeding internal hemorrhoids were found during retroflexion. The       hemorrhoids were Grade I (internal hemorrhoids that do not prolapse).      A 5 mm polyp was found in the transverse colon. The polyp was sessile.       The polyp was removed with a cold snare. Resection and retrieval were       complete.      The exam was otherwise without abnormality. Impression:            - Non-bleeding internal hemorrhoids.                        - One 5 mm polyp in the ascending colon, removed with                         a cold snare. Resected and retrieved.                        - The examination was otherwise normal. Recommendation:        - Patient has a contact number available for                         emergencies. The signs and symptoms of potential                         delayed complications were discussed with the patient.                         Return to normal activities tomorrow. Written                         discharge instructions were provided to the patient.                        -  Resume previous diet.                        - Continue present medications.                        - Await pathology results.                        - Repeat colonoscopy in 5 years for screening purposes.                        - Return to GI office PRN.                        - The findings and recommendations were discussed with                         the patient. Procedure Code(s):     ---  Professional ---                        [phone], Colonoscopy, flexible; with removal of                         tumor(s), polyp(s), or other lesion(s) by snare                         technique Diagnosis Code(s):     --- Professional ---                        K64.0, First degree hemorrhoids                        K63.5, Polyp of colon                        Z80.0, Family history of malignant neoplasm of                         digestive organs CPT copyright 2019 American Medical Association. All rights reserved. The codes documented in this report are preliminary and upon coder review may  be revised to meet current compliance requirements. Efrain Sella MD, MD 02/09/2019 8:55:31 AM This report has been signed electronically. Number of Addenda: 0 Note Initiated On: 02/09/2019 8:20 AM Scope Withdrawal Time: 0 hours 10 minutes 38 seconds  Total Procedure Duration: 0 hours 15 minutes 26 seconds  Estimated Blood Loss:  Estimated blood loss: none. Estimated blood loss: none.      [facility]

## 2019-02-09 NOTE — Transfer of Care (Signed)
Immediate Anesthesia Transfer of Care Note  Patient: Greg Barnes  Procedure(s) Performed: COLONOSCOPY WITH PROPOFOL (N/A )  Patient Location: Endoscopy Unit  Anesthesia Type:General  Level of Consciousness: drowsy and patient cooperative  Airway & Oxygen Therapy: Patient Spontanous Breathing  Post-op Assessment: Report given to RN and Post -op Vital signs reviewed and stable  Post vital signs: Reviewed and stable  Last Vitals:  Vitals Value Taken Time  BP 93/55 02/09/19 0856  Temp 36.3 C 02/09/19 0852  Pulse 83 02/09/19 0856  Resp 17 02/09/19 0856  SpO2 96 % 02/09/19 0856  Vitals shown include unvalidated device data.  Last Pain:  Vitals:   02/09/19 0852  TempSrc: Temporal  PainSc: 0-No pain         Complications: No apparent anesthesia complications

## 2019-02-09 NOTE — H&P (Signed)
Outpatient short stay form Pre-procedure 02/09/2019 8:27 AM Greg Barnes K. [name], M.D.  Primary Physician: Emily Filbert, M.D.  Reason for visit:  Family hx of colon cancer  History of present illness:  63 year old patient presenting for family history of colon cancer. Patient denies any change in bowel habits, rectal bleeding or involuntary weight loss.    Current Facility-Administered Medications:  .  0.9 %  sodium chloride infusion, , Intravenous, Continuous, [place], Benay Pike, MD, Last Rate: 20 mL/hr at 02/09/19 0754  Medications Prior to Admission  Medication Sig Dispense Refill Last Dose  . cyanocobalamin 500 MCG tablet Take 500 mcg by mouth daily.   02/08/2019 at Unknown time  . telmisartan (MICARDIS) 40 MG tablet Take 1 tablet by mouth daily.  3 02/09/2019 at 0550     No Known Allergies   Past Medical History:  Diagnosis Date  . Hypertension   . Obstructive sleep apnea   . Sinus arrhythmia    intermittent    Review of systems:  Otherwise negative.    Physical Exam  Gen: Alert, oriented. Appears stated age.  HEENT: [place]/AT. PERRLA. Lungs: CTA, no wheezes. CV: RR nl S1, S2. Abd: soft, benign, no masses. BS+ Ext: No edema. Pulses 2+    Planned procedures: Proceed with colonoscopy. The patient understands the nature of the planned procedure, indications, risks, alternatives and potential complications including but not limited to bleeding, infection, perforation, damage to internal organs and possible oversedation/side effects from anesthesia. The patient agrees and gives consent to proceed.  Please refer to procedure notes for findings, recommendations and patient disposition/instructions.     Greg Barnes K. [name], M.D. Gastroenterology 02/09/2019  8:27 AM

## 2019-02-10 ENCOUNTER — Encounter: Payer: Self-pay | Admitting: *Deleted

## 2019-02-10 LAB — SURGICAL PATHOLOGY

## 2020-05-02 IMAGING — CT CT ANGIOGRAPHY HEAD
1 of 10 series · 6 of 33 positions shown · IV contrast (omnipaque)
Comparison: CT head 07/31/2015.

CLINICAL DATA: Loss of vision in LEFT eye for 2-3 minutes.
Posterior headache. This occurred earlier today.

EXAM:
CT ANGIOGRAPHY HEAD AND NECK
TECHNIQUE: Multidetector CT imaging of the head and neck was performed using
the standard protocol during bolus administration of intravenous
contrast. Multiplanar CT image reconstructions and MIPs were
obtained to evaluate the vascular anatomy. Carotid stenosis
measurements (when applicable) are obtained utilizing NASCET
criteria, using the distal internal carotid diameter as the
denominator.
CONTRAST:  75mL OMNIPAQUE IOHEXOL 350 MG/ML SOLN

[Series 11: ax thin · axial · 0.46mm/px · z∈[-388,-98]mm · 6 of 408 slices shown]
[im 59/408  soft-tissue]
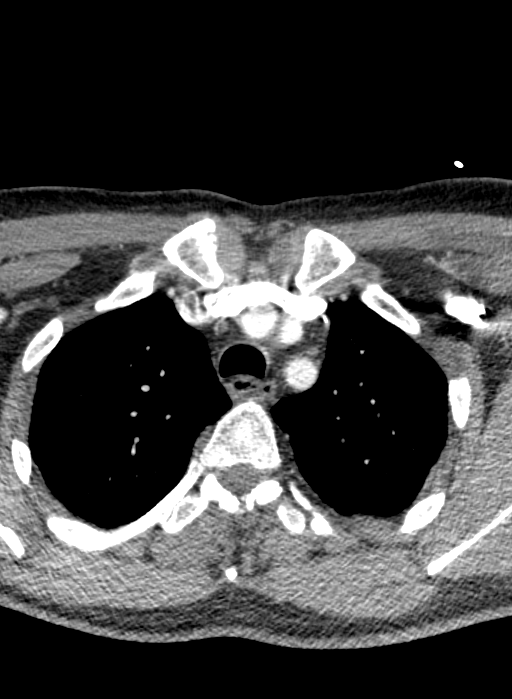
[im 117/408  bone]
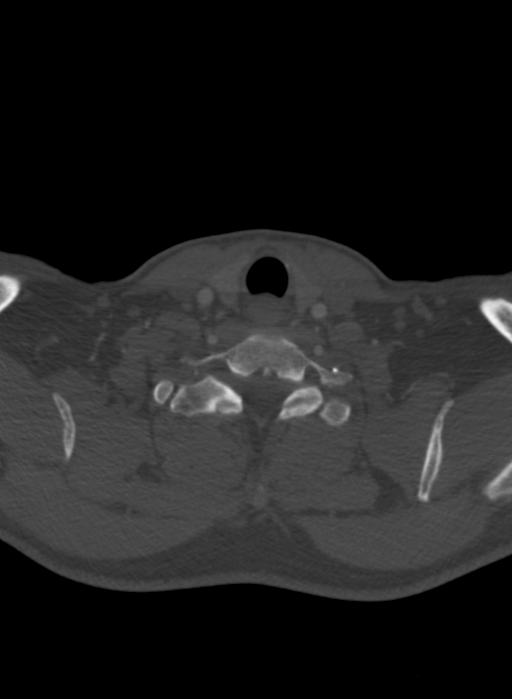
[im 175/408  soft-tissue]
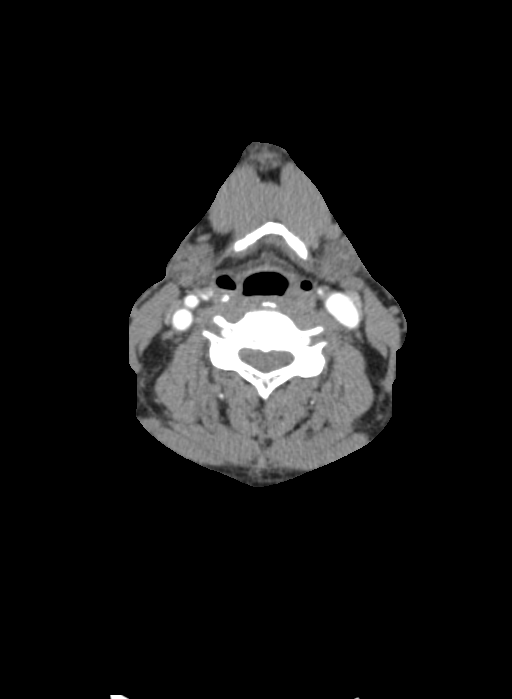
[im 233/408  bone]
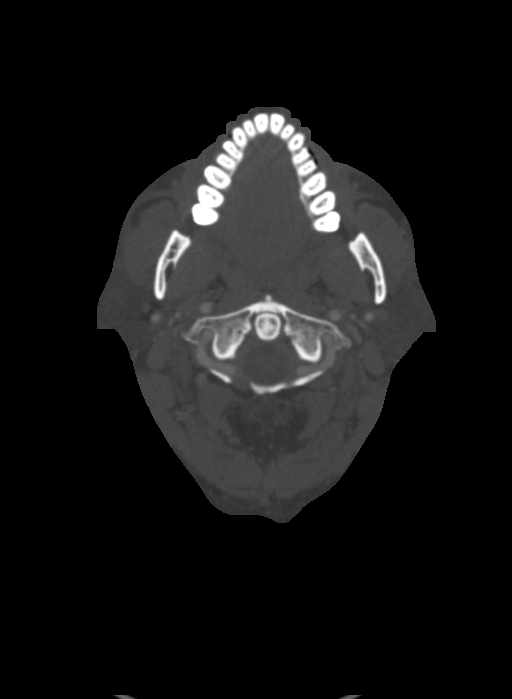
[im 291/408  soft-tissue]
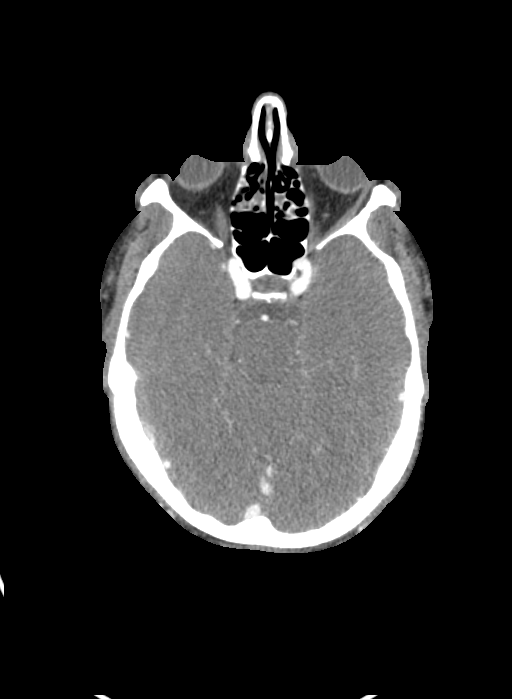
[im 349/408  bone]
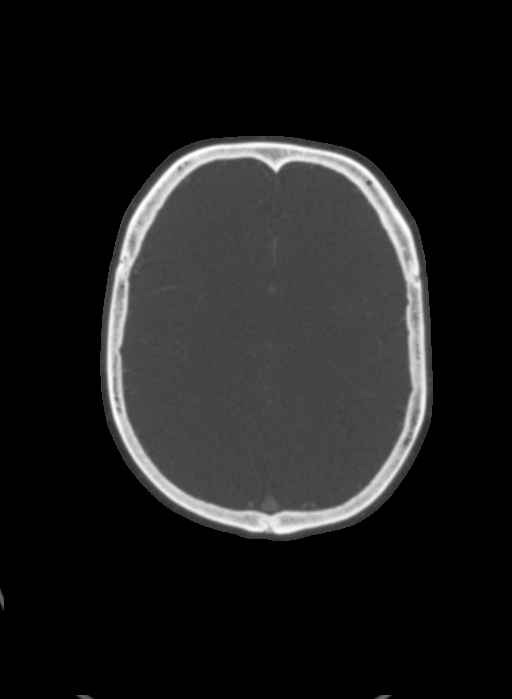

[6 of 33 positions shown; findings below may reference images not displayed]

FINDINGS: CT HEAD FINDINGS

Brain: No evidence of acute infarction, hemorrhage, hydrocephalus,
extra-axial collection or mass lesion/mass effect.

Vascular: Reported separately.

Skull: Intact.

Sinuses: No significant opacity.

Orbits: Normal.

Review of the MIP images confirms the above findings

CTA NECK FINDINGS

Aortic arch: Standard branching. Imaged portion shows no evidence of
aneurysm or dissection. No significant stenosis of the major arch
vessel origins.

Right carotid system: No evidence of dissection, stenosis (50% or
greater) or occlusion.

Left carotid system: No evidence of dissection, stenosis (50% or
greater) or occlusion.

Vertebral arteries: Codominant. No evidence of dissection, stenosis
(50% or greater) or occlusion.

Skeleton: No acute or aggressive finding.

Other neck: Noncontributory.

Upper chest: No mass or pneumothorax.

Review of the MIP images confirms the above findings

CTA HEAD FINDINGS

Anterior circulation: No significant stenosis, proximal occlusion,
aneurysm, or vascular malformation.

Posterior circulation: No significant stenosis, proximal occlusion,
aneurysm, or vascular malformation.

Venous sinuses: As permitted by contrast timing, patent.

Anatomic variants: LEFT fetal PCA.

Delayed phase: Not performed.

Review of the MIP images confirms the above findings
IMPRESSION: Negative exam.

## 2023-05-18 ENCOUNTER — Other Ambulatory Visit: Payer: Self-pay | Admitting: Orthopedic Surgery

## 2023-05-18 DIAGNOSIS — S22000S Wedge compression fracture of unspecified thoracic vertebra, sequela: Secondary | ICD-10-CM

## 2023-05-20 ENCOUNTER — Ambulatory Visit
Admission: RE | Admit: 2023-05-20 | Discharge: 2023-05-20 | Disposition: A | Discharge status: Home / Self Care | Source: Ambulatory Visit | Attending: Orthopedic Surgery | Admitting: Orthopedic Surgery

## 2023-05-20 DIAGNOSIS — S22000S Wedge compression fracture of unspecified thoracic vertebra, sequela: Secondary | ICD-10-CM | POA: Insufficient documentation

## 2023-10-26 ENCOUNTER — Other Ambulatory Visit: Payer: Self-pay | Admitting: Medical Genetics

## 2023-10-30 ENCOUNTER — Other Ambulatory Visit: Payer: Self-pay | Admitting: Medical Genetics

## 2023-10-30 DIAGNOSIS — Z006 Encounter for examination for normal comparison and control in clinical research program: Secondary | ICD-10-CM

## 2023-11-13 LAB — GENECONNECT MOLECULAR SCREEN: Genetic Analysis Overall Interpretation: NEGATIVE
